# Patient Record
Sex: Male | Born: 1970 | Race: White | Hispanic: No | Marital: Single | Smoking: Never smoker
Health system: Southern US, Community
[De-identification: ages and names within clinical notes are randomized; demographics above are authoritative.]

---

## 2020-08-11 ENCOUNTER — Emergency Department
Admission: EM | Admit: 2020-08-11 | Discharge: 2020-08-11 | Disposition: A | Discharge status: Home / Self Care | Payer: Self-pay | Attending: Emergency Medicine | Admitting: Emergency Medicine

## 2020-08-11 ENCOUNTER — Emergency Department: Payer: Self-pay

## 2020-08-11 ENCOUNTER — Encounter: Payer: Self-pay | Admitting: Emergency Medicine

## 2020-08-11 ENCOUNTER — Other Ambulatory Visit: Payer: Self-pay

## 2020-08-11 DIAGNOSIS — M25512 Pain in left shoulder: Secondary | ICD-10-CM | POA: Insufficient documentation

## 2020-08-11 DIAGNOSIS — Z87891 Personal history of nicotine dependence: Secondary | ICD-10-CM | POA: Insufficient documentation

## 2020-08-11 DIAGNOSIS — R0789 Other chest pain: Secondary | ICD-10-CM | POA: Insufficient documentation

## 2020-08-11 DIAGNOSIS — R202 Paresthesia of skin: Secondary | ICD-10-CM | POA: Insufficient documentation

## 2020-08-11 LAB — CBC
HCT: 31.6 % — ABNORMAL LOW (ref 39.0–52.0)
Hemoglobin: 10.3 g/dL — ABNORMAL LOW (ref 13.0–17.0)
MCH: 26.3 pg (ref 26.0–34.0)
MCHC: 32.6 g/dL (ref 30.0–36.0)
MCV: 80.6 fL (ref 80.0–100.0)
Platelets: 580 10*3/uL — ABNORMAL HIGH (ref 150–400)
RBC: 3.92 MIL/uL — ABNORMAL LOW (ref 4.22–5.81)
RDW: 20.4 % — ABNORMAL HIGH (ref 11.5–15.5)
WBC: 10.7 10*3/uL — ABNORMAL HIGH (ref 4.0–10.5)
nRBC: 0 % (ref 0.0–0.2)

## 2020-08-11 LAB — BASIC METABOLIC PANEL
Anion gap: 11 (ref 5–15)
BUN: 17 mg/dL (ref 6–20)
CO2: 26 mmol/L (ref 22–32)
Calcium: 9.4 mg/dL (ref 8.9–10.3)
Chloride: 98 mmol/L (ref 98–111)
Creatinine, Ser: 0.56 mg/dL — ABNORMAL LOW (ref 0.61–1.24)
GFR calc Af Amer: >60 mL/min (ref >60)
GFR calc non Af Amer: >60 mL/min (ref >60)
Glucose, Bld: 100 mg/dL — ABNORMAL HIGH (ref 70–99)
Potassium: 4.2 mmol/L (ref 3.5–5.1)
Sodium: 135 mmol/L (ref 135–145)

## 2020-08-11 LAB — TROPONIN I (HIGH SENSITIVITY)
Troponin I (High Sensitivity): 3 ng/L (ref <18)
Troponin I (High Sensitivity): 5 ng/L (ref <18)

## 2020-08-11 NOTE — Discharge Instructions (Signed)
Please take Tylenol and ibuprofen/Advil for your pain.  It is safe to take them together, or to alternate them every few hours.  Take up to 1000mg  of Tylenol at a time, up to 4 times per day.  Do not take more than 4000 mg of Tylenol in 24 hours.  For ibuprofen, take 400-600 mg, 4-5 times per day.  Thankfully, there is no evidence of strain or damage to your heart.  You have not had a heart attack.  Good luck quitting smoking.  This is extremely difficult to do and I applaud you for taking the steps that you have already taken.  If you develop any worsening chest pains, especially in combination with fevers, passing out or throwing up, please return to the ED.

## 2020-08-11 NOTE — ED Provider Notes (Signed)
[facility] Emergency Department Provider Note ____________________________________________   First MD Initiated Contact with Patient 08/11/20 2109     (approximate)  I have reviewed the triage vital signs and the nursing notes.  HISTORY  Chief Complaint Chest Pain   HPI Todd Munoz is a 49 y.o. malewho presents to the ED for evaluation of chest pain.   Chart review indicates no relevant medical hx.   Patient reports stopping smoking cigarettes cold [place] about 1 week ago. Further reports his mother dying a few weeks ago and underwent a relative increase amount of stress.  Patient reports being in his typical state of health until developing rapid onset left shoulder/arm/chest pain with associated tingling to his left-sided fingertips this afternoon while in a seated position.  He reports it discomfort and denies any pain, so he does not quantify this pain.  He minimizes it severity indicates that it was not that bad, though did frighten him that he was having a heart attack.  Beyond the tingling to his left-sided fingertips, he denies any associated symptoms.  Denies nausea, vomiting, diaphoresis, syncope, shortness of breath, trauma.  He denies any chest pain now and reports feeling better after I tell him he has not had a heart attack.    No past medical history on file.  There are no problems to display for this patient.   History reviewed. No pertinent surgical history.  Prior to Admission medications   Not on File    Allergies Erythromycin, Penicillins, and Sulfa antibiotics  No family history on file.  Social History Social History   Tobacco Use  . Smoking status: Not on file  Substance Use Topics  . Alcohol use: Not on file  . Drug use: Not on file    Review of Systems  Constitutional: No fever/chills Eyes: No visual changes. ENT: No sore throat. Cardiovascular: Positive for chest pain Respiratory: Denies shortness of  breath. Gastrointestinal: No abdominal pain.  No nausea, no vomiting.  No diarrhea.  No constipation. Genitourinary: Negative for dysuria. Musculoskeletal: Negative for back pain. Skin: Negative for rash. Neurological: Negative for headaches, focal weakness or numbness.   ____________________________________________   PHYSICAL EXAM:  VITAL SIGNS: Vitals:   08/11/20 2001  BP: 116/76  Pulse: 77  Resp: 20  Temp: 98.4 F (36.9 C)  SpO2: 96%      Constitutional: Alert and oriented. Well appearing and in no acute distress. Eyes: Conjunctivae are normal. PERRL. EOMI. Head: Atraumatic. Nose: No congestion/rhinnorhea. Mouth/Throat: Mucous membranes are moist.  Oropharynx non-erythematous. Neck: No stridor. No cervical spine tenderness to palpation. Cardiovascular: Normal rate, regular rhythm. Grossly normal heart sounds.  Good peripheral circulation. Respiratory: Normal respiratory effort.  No retractions. Lungs CTAB. Gastrointestinal: Soft , nondistended, nontender to palpation. No abdominal bruits. No CVA tenderness. Musculoskeletal: No lower extremity tenderness nor edema.  No joint effusions. No signs of acute trauma. Neurologic:  Normal speech and language. No gross focal neurologic deficits are appreciated. No gait instability noted. Skin:  Skin is warm, dry and intact. No rash noted. Psychiatric: Mood and affect are normal. Speech and behavior are normal.  ____________________________________________   LABS (all labs ordered are listed, but only abnormal results are displayed)  Labs Reviewed  BASIC METABOLIC PANEL - Abnormal; Notable for the following components:      Result Value   Glucose, Bld 100 (*)    Creatinine, Ser 0.56 (*)    All other components within normal limits  CBC - Abnormal; Notable for  the following components:   WBC 10.7 (*)    RBC 3.92 (*)    Hemoglobin 10.3 (*)    HCT 31.6 (*)    RDW 20.4 (*)    Platelets 580 (*)    All other components  within normal limits  TROPONIN I (HIGH SENSITIVITY)  TROPONIN I (HIGH SENSITIVITY)   ____________________________________________  12 Lead EKG  Sinus rhythm, rate of 77 bpm.  Normal axis and intervals.  No evidence of acute ischemia. ____________________________________________  RADIOLOGY  ED MD interpretation: 2 view CXR reviewed without evidence of acute cardiopulmonary pathology.  Official radiology report(s): DG Chest 2 View  Result Date: 08/11/2020 CLINICAL DATA:  Chest pain EXAM: CHEST - 2 VIEW COMPARISON:  None. FINDINGS: The heart size and mediastinal contours are within normal limits. Both lungs are clear. The visualized skeletal structures are unremarkable. IMPRESSION: No active cardiopulmonary disease. Electronically Signed   By: Jonna Clark M.D.   On: 08/11/2020 20:40    ____________________________________________   PROCEDURES and INTERVENTIONS  Procedure(s) performed (including Critical Care):  Procedures  Medications - No data to display  ____________________________________________   MDM / ED COURSE  49 year old male presents with atypical chest pain without evidence of ACS and amenable to outpatient management.  Normal vital signs.  Normal exam.  Asymptomatic in ED.  Benign work-up.  Nonischemic EKG negative troponin.  We discussed outpatient management and return precautions for the ED.  Patient medically stable for discharge home.  Clinical Course as of Aug 12 2243  Fri Aug 11, 2020  2242 Educated patient on repeat troponin negative and reassuring.  He continues to be chest pain-free.  We discussed outpatient management and return precautions for the ED.   [DS]    Clinical Course User Index [DS] Delton Prairie, MD     ____________________________________________   FINAL CLINICAL IMPRESSION(S) / ED DIAGNOSES  Final diagnoses:  Other chest pain  Tingling of upper extremity     ED Discharge Orders    None       Youlanda Tomassetti   Note:   This document was prepared using Dragon voice recognition software and may include unintentional dictation errors.   Delton Prairie, MD 08/11/20 [phone]

## 2020-08-11 NOTE — ED Triage Notes (Signed)
Pt reports chest pain couple of hrs ago, reports was sitting no exertion. reports felt dizzy, reports at time of chest pain mild shortness of breath, tingling to left finger , lpt reports he took 2 baby aspirin and 20mg  of propranlol that he had.  Pt talks in complete sentences no respiratory distress noted.

## 2021-05-06 IMAGING — CR DG CHEST 2V
1 series · 2 of 2 positions shown · non-contrast
Comparison: None.

CLINICAL DATA: Chest pain

EXAM:
CHEST - 2 VIEW

[Series 1: dg chest 2 view · 0.14mm/px · 2 of 2 slices shown]
[im 1/2]
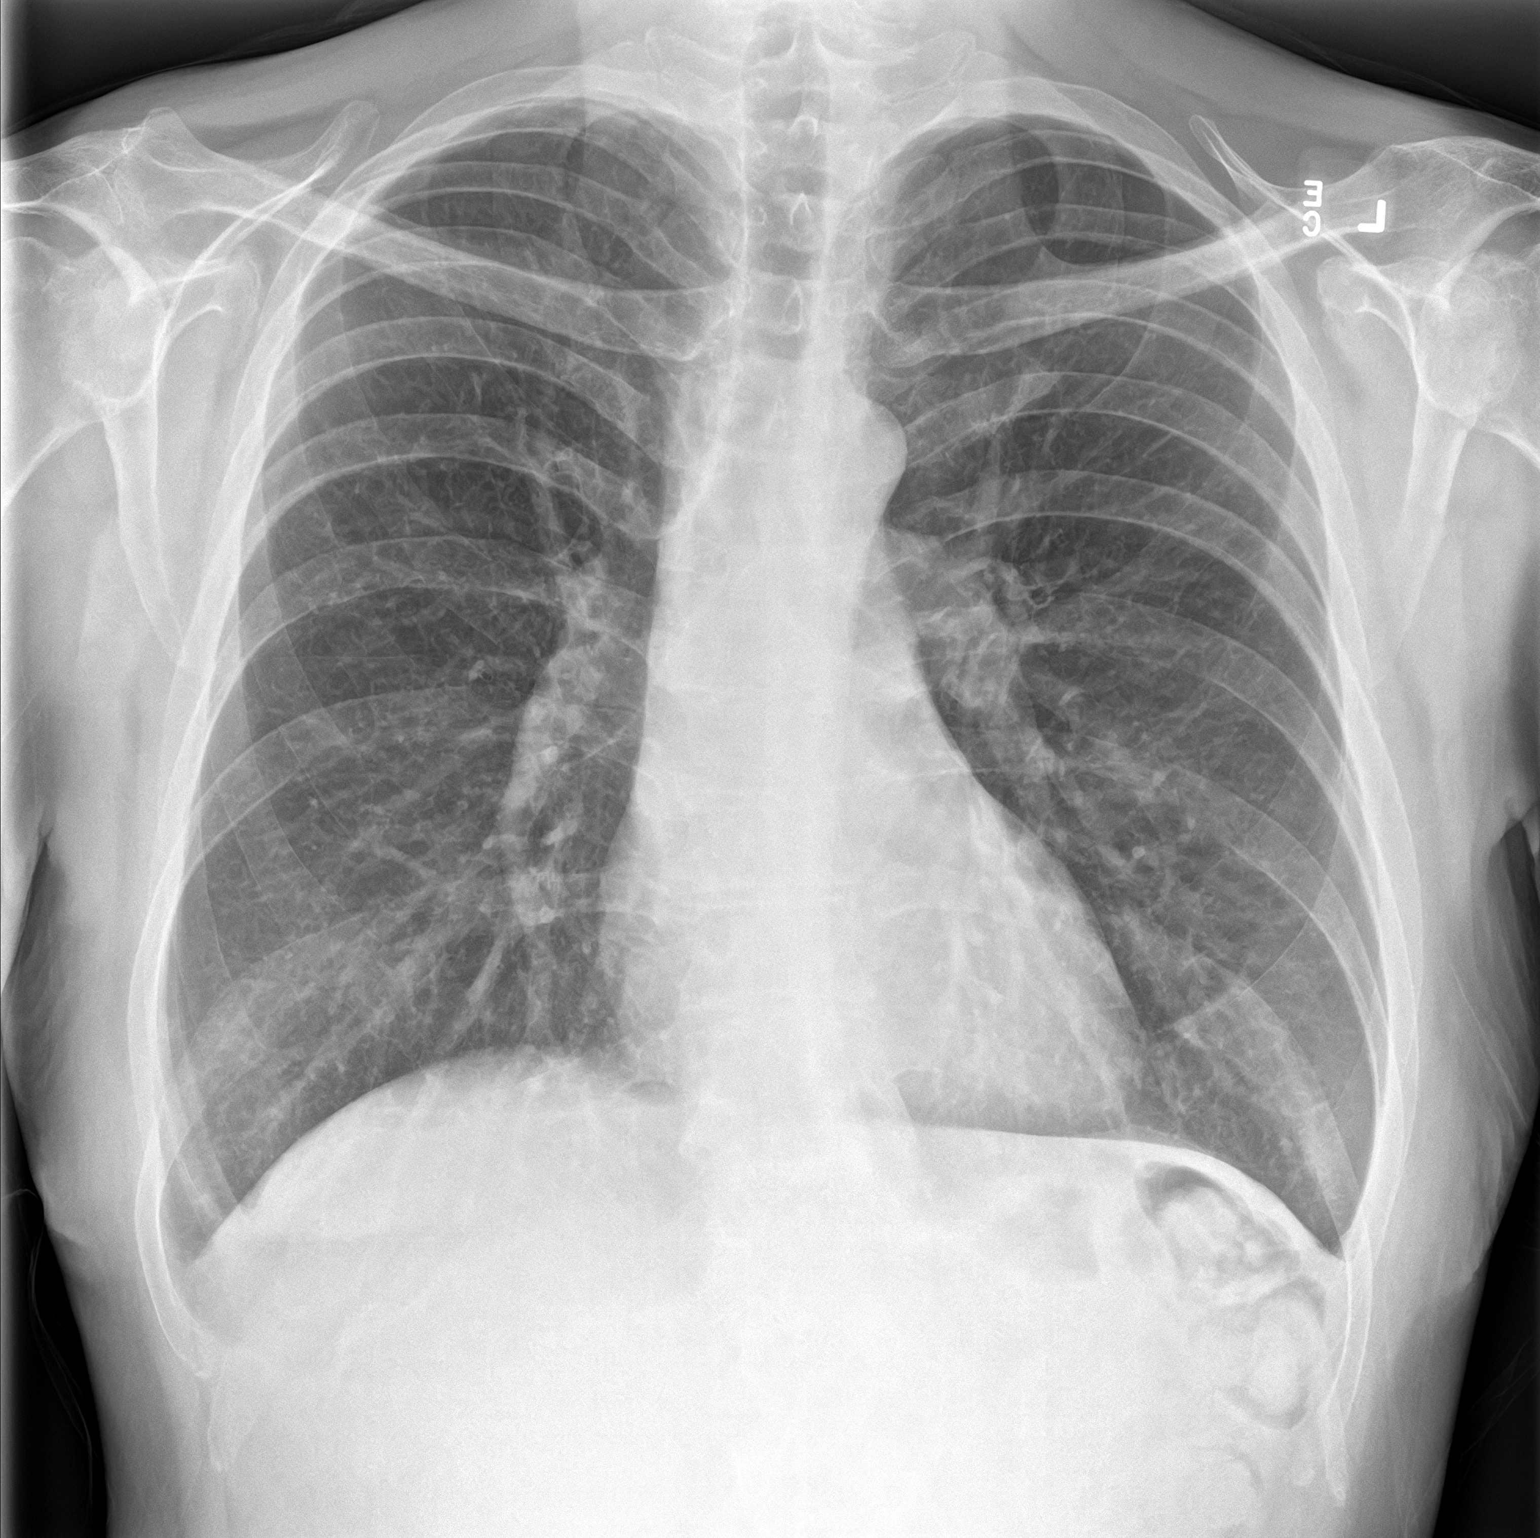
[im 2/2]
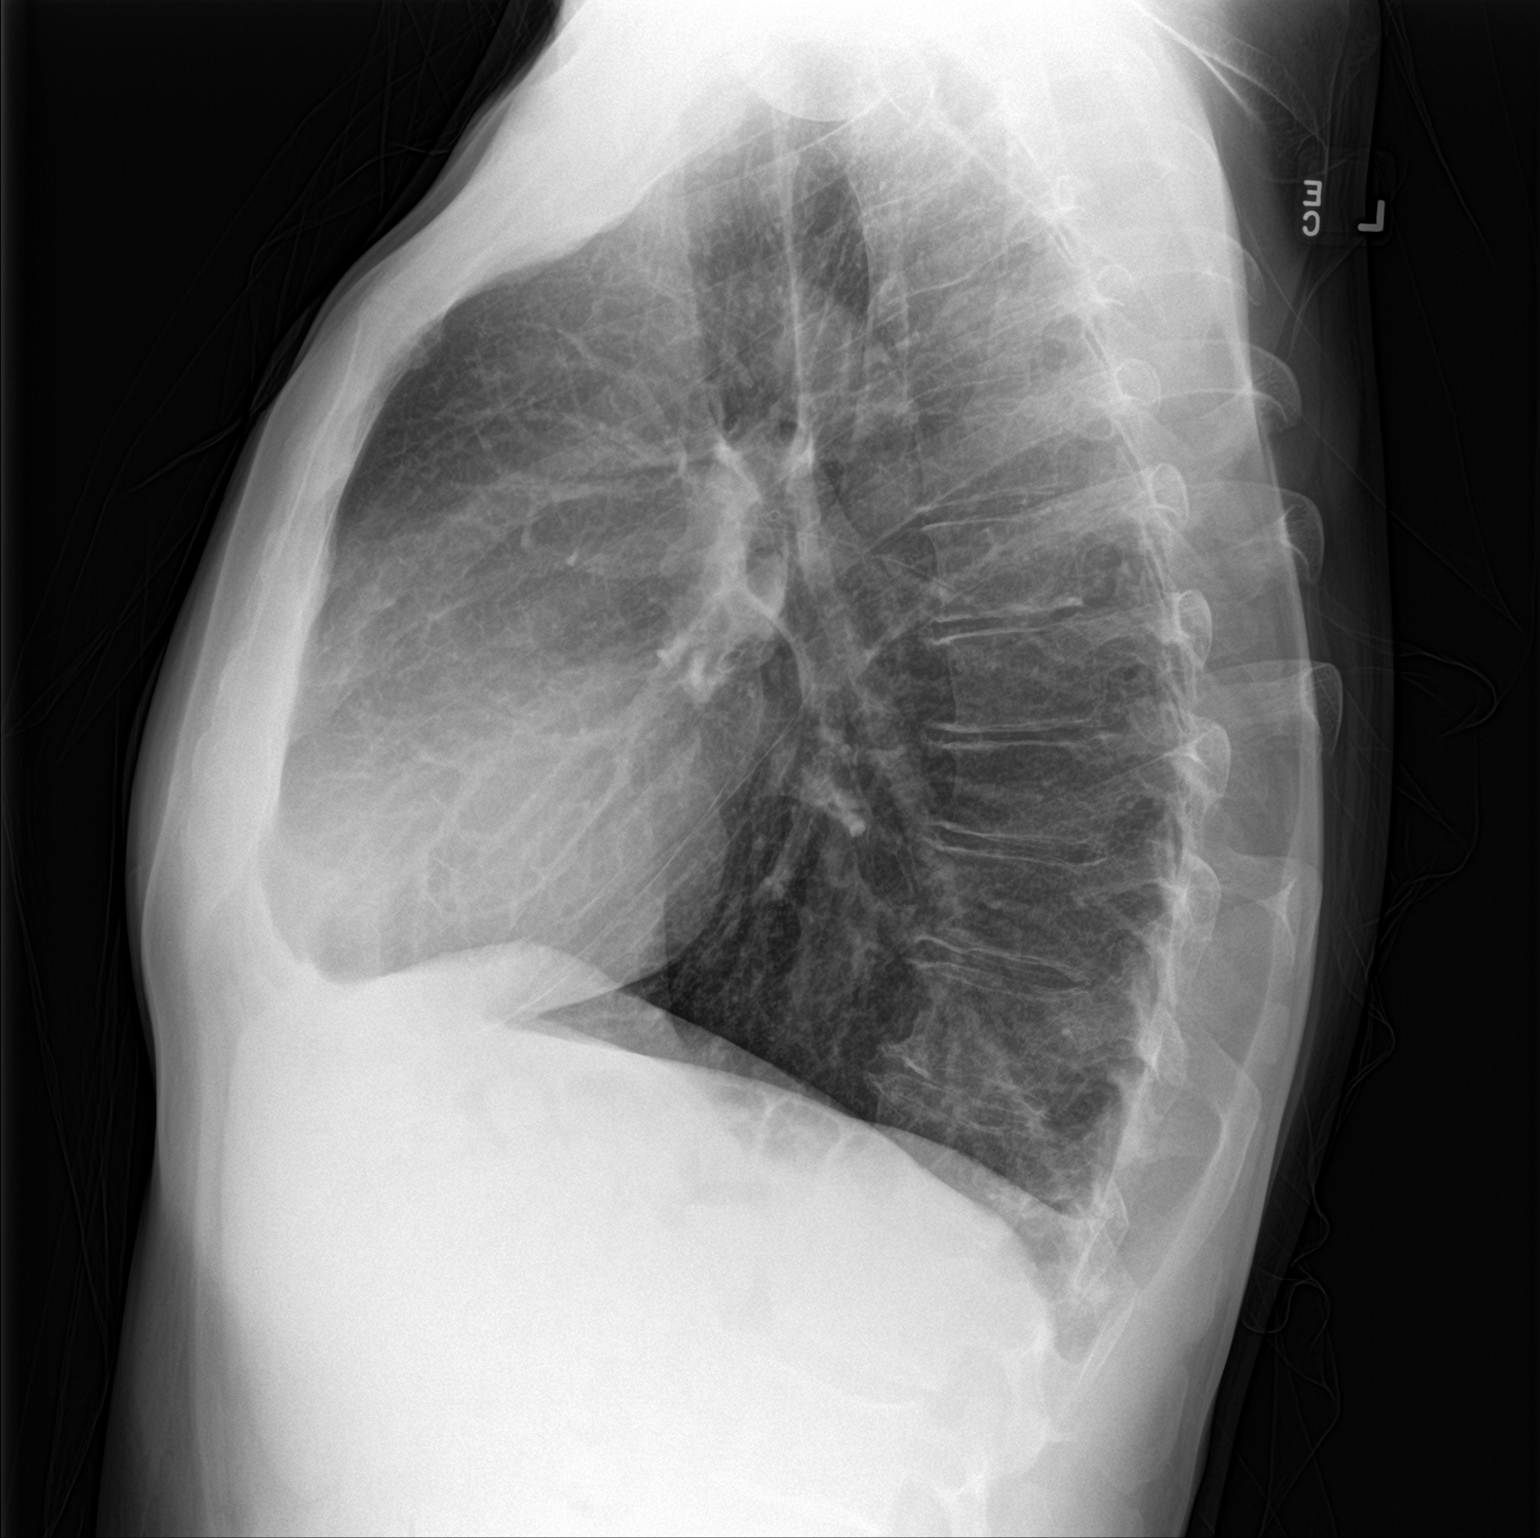

[2 of 2 positions shown; findings below may reference images not displayed]

FINDINGS: The heart size and mediastinal contours are within normal limits.
Both lungs are clear. The visualized skeletal structures are
unremarkable.
IMPRESSION: No active cardiopulmonary disease.

## 2022-10-20 ENCOUNTER — Emergency Department: Payer: 59

## 2022-10-20 ENCOUNTER — Other Ambulatory Visit: Payer: Self-pay

## 2022-10-20 ENCOUNTER — Emergency Department
Admission: EM | Admit: 2022-10-20 | Discharge: 2022-10-20 | Disposition: A | Discharge status: Home / Self Care | Payer: 59 | Attending: Emergency Medicine | Admitting: Emergency Medicine

## 2022-10-20 DIAGNOSIS — R109 Unspecified abdominal pain: Secondary | ICD-10-CM | POA: Diagnosis not present

## 2022-10-20 DIAGNOSIS — R1011 Right upper quadrant pain: Secondary | ICD-10-CM | POA: Diagnosis not present

## 2022-10-20 DIAGNOSIS — I700 Atherosclerosis of aorta: Secondary | ICD-10-CM | POA: Diagnosis not present

## 2022-10-20 DIAGNOSIS — N200 Calculus of kidney: Secondary | ICD-10-CM

## 2022-10-20 DIAGNOSIS — D72829 Elevated white blood cell count, unspecified: Secondary | ICD-10-CM | POA: Diagnosis not present

## 2022-10-20 DIAGNOSIS — N1330 Unspecified hydronephrosis: Secondary | ICD-10-CM | POA: Diagnosis not present

## 2022-10-20 LAB — CBC WITH DIFFERENTIAL/PLATELET
Abs Immature Granulocytes: 0.05 10*3/uL (ref 0.00–0.07)
Basophils Absolute: 0.1 10*3/uL (ref 0.0–0.1)
Basophils Relative: 1 %
Eosinophils Absolute: 0.1 10*3/uL (ref 0.0–0.5)
Eosinophils Relative: 0 %
HCT: 38.4 % — ABNORMAL LOW (ref 39.0–52.0)
Hemoglobin: 12.8 g/dL — ABNORMAL LOW (ref 13.0–17.0)
Immature Granulocytes: 0 %
Lymphocytes Relative: 9 %
Lymphs Abs: 1.4 10*3/uL (ref 0.7–4.0)
MCH: 30.3 pg (ref 26.0–34.0)
MCHC: 33.3 g/dL (ref 30.0–36.0)
MCV: 90.8 fL (ref 80.0–100.0)
Monocytes Absolute: 1 10*3/uL (ref 0.1–1.0)
Monocytes Relative: 6 %
Neutro Abs: 12.7 10*3/uL — ABNORMAL HIGH (ref 1.7–7.7)
Neutrophils Relative %: 84 %
Platelets: 423 10*3/uL — ABNORMAL HIGH (ref 150–400)
RBC: 4.23 MIL/uL (ref 4.22–5.81)
RDW: 14.6 % (ref 11.5–15.5)
WBC: 15.3 10*3/uL — ABNORMAL HIGH (ref 4.0–10.5)
nRBC: 0 % (ref 0.0–0.2)

## 2022-10-20 LAB — URINALYSIS, ROUTINE W REFLEX MICROSCOPIC
Bacteria, UA: NONE SEEN
Bilirubin Urine: NEGATIVE
Glucose, UA: NEGATIVE mg/dL
Ketones, ur: 80 mg/dL — AB
Leukocytes,Ua: NEGATIVE
Nitrite: NEGATIVE
Protein, ur: 30 mg/dL — AB
RBC / HPF: >50 RBC/hpf — ABNORMAL HIGH (ref 0–5)
Specific Gravity, Urine: 1.034 — ABNORMAL HIGH (ref 1.005–1.030)
pH: 8 (ref 5.0–8.0)

## 2022-10-20 LAB — COMPREHENSIVE METABOLIC PANEL
ALT: 14 U/L (ref 0–44)
AST: 28 U/L (ref 15–41)
Albumin: 4.2 g/dL (ref 3.5–5.0)
Alkaline Phosphatase: 68 U/L (ref 38–126)
Anion gap: 9 (ref 5–15)
BUN: 17 mg/dL (ref 6–20)
CO2: 24 mmol/L (ref 22–32)
Calcium: 9.7 mg/dL (ref 8.9–10.3)
Chloride: 105 mmol/L (ref 98–111)
Creatinine, Ser: 0.65 mg/dL (ref 0.61–1.24)
GFR, Estimated: >60 mL/min (ref >60)
Glucose, Bld: 148 mg/dL — ABNORMAL HIGH (ref 70–99)
Potassium: 3.7 mmol/L (ref 3.5–5.1)
Sodium: 138 mmol/L (ref 135–145)
Total Bilirubin: 1 mg/dL (ref 0.3–1.2)
Total Protein: 7.3 g/dL (ref 6.5–8.1)

## 2022-10-20 LAB — LIPASE, BLOOD: Lipase: 23 U/L (ref 11–51)

## 2022-10-20 MED ORDER — ONDANSETRON HCL 4 MG/2ML IJ SOLN
4.0000 mg | Freq: Once | INTRAMUSCULAR | Status: AC
  Administered 2022-10-20: 4 mg via INTRAVENOUS
  Filled 2022-10-20: qty 2

## 2022-10-20 MED ORDER — CEPHALEXIN 500 MG PO CAPS: 500.0000 mg | ORAL_CAPSULE | Freq: Three times a day (TID) | ORAL | 0 refills | Status: AC

## 2022-10-20 MED ORDER — KETOROLAC TROMETHAMINE 30 MG/ML IJ SOLN
30.0000 mg | Freq: Once | INTRAMUSCULAR | Status: AC
  Administered 2022-10-20: 30 mg via INTRAVENOUS
  Filled 2022-10-20: qty 1

## 2022-10-20 MED ORDER — SODIUM CHLORIDE 0.9 % IV BOLUS
500.0000 mL | Freq: Once | INTRAVENOUS | Status: AC
  Administered 2022-10-20: 500 mL via INTRAVENOUS

## 2022-10-20 MED ORDER — IOHEXOL 350 MG/ML SOLN
100.0000 mL | Freq: Once | INTRAVENOUS | Status: AC | PRN
  Administered 2022-10-20: 100 mL via INTRAVENOUS

## 2022-10-20 MED ORDER — MORPHINE SULFATE (PF) 4 MG/ML IV SOLN
4.0000 mg | Freq: Once | INTRAVENOUS | Status: AC
  Administered 2022-10-20: 4 mg via INTRAVENOUS
  Filled 2022-10-20: qty 1

## 2022-10-20 MED ORDER — SODIUM CHLORIDE 0.9 % IV BOLUS (SEPSIS)
1000.0000 mL | Freq: Once | INTRAVENOUS | Status: AC
  Administered 2022-10-20: 1000 mL via INTRAVENOUS

## 2022-10-20 MED ORDER — CEPHALEXIN 500 MG PO CAPS
500.0000 mg | ORAL_CAPSULE | Freq: Once | ORAL | Status: AC
  Administered 2022-10-20: 500 mg via ORAL
  Filled 2022-10-20: qty 1

## 2022-10-20 MED ORDER — OXYCODONE-ACETAMINOPHEN 5-325 MG PO TABS: 1.0000 | ORAL_TABLET | ORAL | 0 refills | Status: AC | PRN

## 2022-10-20 MED ORDER — ONDANSETRON 4 MG PO TBDP: 4.0000 mg | ORAL_TABLET | Freq: Three times a day (TID) | ORAL | 0 refills | Status: AC | PRN

## 2022-10-20 MED ORDER — HYDROMORPHONE HCL 1 MG/ML IJ SOLN
1.0000 mg | Freq: Once | INTRAMUSCULAR | Status: AC
  Administered 2022-10-20: 1 mg via INTRAVENOUS
  Filled 2022-10-20: qty 1

## 2022-10-20 NOTE — ED Notes (Signed)
Patient transported back from CT 

## 2022-10-20 NOTE — ED Triage Notes (Signed)
Pt reports right flank pain beginning last night before going to sleep, pt thought he was going to have diarrhea. Pt has had constant cramping in same spot since. Pt denies N/V/fever.

## 2022-10-20 NOTE — ED Provider Notes (Addendum)
[facility] Provider Note    Event Date/Time   First MD Initiated Contact with Patient 10/20/22 [phone]     (approximate)   History   Flank Pain   HPI  Todd Munoz is a 51 y.o. 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  Has had nausea but no vomiting or diarrhea.  No fevers.  No dysuria or hematuria.  Never had similar symptoms.  No other prior abdominal surgeries.  No history of kidney stones, gallstones.   History provided by patient.    History reviewed. No pertinent past medical history.  History reviewed. No pertinent surgical history.  MEDICATIONS:  Prior to Admission medications   Not on File    Physical Exam   Triage Vital Signs: ED Triage Vitals [10/20/22 0522]  Enc Vitals Group     BP (!) 152/87     Pulse Rate 68     Resp 17     Temp 98.2 F (36.8 C)     Temp Source Oral     SpO2 100 %     Weight 220 lb (99.8 kg)     Height 6\' 4"  (1.93 m)     Head Circumference      Peak Flow      Pain Score 8     Pain Loc      Pain Edu?      Excl. in GC?     Most recent vital signs: Vitals:   10/20/22 0545 10/20/22 0630  BP: (!) 148/87 (!) 147/90  Pulse: 70 64  Resp: 18 20  Temp:    SpO2: 100% 100%    CONSTITUTIONAL: Alert and oriented and responds appropriately to questions.  Appears uncomfortable HEAD: Normocephalic, atraumatic EYES: Conjunctivae clear, pupils appear equal, sclera nonicteric ENT: normal nose; moist mucous membranes NECK: Supple, normal ROM CARD: RRR; S1 and S2 appreciated; no murmurs, no clicks, no rubs, no gallops RESP: Normal chest excursion without splinting or tachypnea; breath sounds clear and equal bilaterally; no wheezes, no rhonchi, no rales, no  hypoxia or respiratory distress, speaking full sentences ABD/GI: Normal bowel sounds; non-distended; soft, tender in the right upper quadrant with voluntary guarding, also tender over the right flank BACK: The back appears normal EXT: Normal ROM in all joints; no deformity noted, no edema; no cyanosis SKIN: Normal color for age and race; warm; no rash on exposed skin NEURO: Moves all extremities equally, normal speech PSYCH: The patient's mood and manner are appropriate.   ED Results / Procedures / Treatments   LABS: (all labs ordered are listed, but only abnormal results are displayed) Labs Reviewed  CBC WITH DIFFERENTIAL/PLATELET - Abnormal; Notable for the following components:      Result Value   WBC 15.3 (*)    Hemoglobin 12.8 (*)    HCT 38.4 (*)    Platelets 423 (*)    Neutro Abs 12.7 (*)    All other components within normal limits  COMPREHENSIVE METABOLIC PANEL - Abnormal; Notable for the following components:   Glucose, Bld 148 (*)    All other components within normal limits  LIPASE, BLOOD  URINALYSIS, ROUTINE W REFLEX MICROSCOPIC     EKG:  RADIOLOGY: My personal review and interpretation of imaging:  10/22/22 unremarkable.  I have personally reviewed all radiology reports.   No results found.   PROCEDURES:  Critical Care performed: No     Procedures    IMPRESSION / MDM / ASSESSMENT AND PLAN / ED COURSE  I reviewed the triage vital signs and the nursing notes.    Patient here with right flank and right upper quadrant abdominal pain.     DIFFERENTIAL DIAGNOSIS (includes but not limited to):   Cholelithiasis, cholecystitis, cholangitis, choledocholithiasis, pancreatitis, pyelonephritis, kidney stone, less likely appendicitis   Patient's presentation is most consistent with acute presentation with potential threat to life or bodily function.   PLAN: We will obtain CBC, CMP, lipase, urinalysis, right upper quadrant ultrasound.  Will give IV fluids,  pain and nausea medicine.  States his aunt drove him here to the emergency department.   MEDICATIONS GIVEN IN ED: Medications  morphine (PF) 4 MG/ML injection 4 mg (4 mg Intravenous Given 10/20/22 0546)  ondansetron (ZOFRAN) injection 4 mg (4 mg Intravenous Given 10/20/22 0546)  sodium chloride 0.9 % bolus 1,000 mL (0 mLs Intravenous Stopped 10/20/22 0640)  HYDROmorphone (DILAUDID) injection 1 mg (1 mg Intravenous Given 10/20/22 0640)  ketorolac (TORADOL) 30 MG/ML injection 30 mg (30 mg Intravenous Given 10/20/22 0640)  iohexol (OMNIPAQUE) 350 MG/ML injection 100 mL (100 mLs Intravenous Contrast Given 10/20/22 0644)     ED COURSE: Labs show leukocytosis of 15,000 with left shift.  Normal electrolytes, renal function, LFTs and lipase.  Right upper quadrant ultrasound reviewed and interpreted by myself and does not show acute abnormality.  Patient continues to have significant pain.  Will give Toradol, morphine and obtain CT of the abdomen pelvis.  7:10 AM  CT scan, urinalysis pending.  Signed out to oncoming ED physician.  Patient seems more comfortable after redosing pain medication.`   CONSULTS:  Dispo pending further workup.   OUTSIDE RECORDS REVIEWED:  none       FINAL CLINICAL IMPRESSION(S) / ED DIAGNOSES   Final diagnoses:  RUQ abdominal pain  Right flank pain     Rx / DC Orders   ED Discharge Orders     None        Note:  This document was prepared using Dragon voice recognition software and may include unintentional dictation errors.   Mekhi Lascola, Layla Maw, DO 10/20/22 0710    Jendaya Gossett, Layla Maw, DO 10/20/22 [phone]

## 2022-10-20 NOTE — ED Provider Notes (Addendum)
Patient received in signout from Dr. Elesa Massed pending follow-up imaging as well as urinalysis.  Patient has evidence of ureteral stone with hydro and possible forniceal rupture.  No urinoma.  Urine is without signs of infection.  Will place on empiric Keflex (has tolerated in the past) given possible forniceal rupture he is currently pain-free.  His well and appropriate for outpatient follow-up.  Discussed case in consultation with urology who will help arrange close outpatient follow-up.  I discussed strict return precautions.   Willy Eddy, MD 10/20/22 [id number]    Willy Eddy, MD 10/20/22 [phone]

## 2022-10-20 NOTE — ED Notes (Signed)
Patient transported to CT 

## 2022-10-20 NOTE — ED Notes (Signed)
Pt in bed, pt reports a decrease in pain, pt states that he is ready to go home, pt from dpt with sig other.

## 2022-10-20 NOTE — ED Notes (Signed)
US at bedside

## 2022-10-21 ENCOUNTER — Telehealth: Payer: Self-pay | Admitting: *Deleted

## 2022-10-21 LAB — URINE CULTURE: Culture: NO GROWTH

## 2022-10-21 NOTE — Telephone Encounter (Signed)
Pt scheduled for [facility] tomorrow

## 2022-10-21 NOTE — Telephone Encounter (Signed)
Tried calling home number for pt and cell phone for emergency contact, left message for Aunt, will try again later

## 2022-10-21 NOTE — Telephone Encounter (Signed)
-----   Message from Vanna Scotland, MD sent at 10/20/2022  9:04 AM EST ----- Regarding: Stone follow-up This guy was in the emergency room on Sunday with an acute stone event.  He also needs follow-up early this week.  Vanna Scotland

## 2022-10-22 ENCOUNTER — Ambulatory Visit
Admission: RE | Admit: 2022-10-22 | Discharge: 2022-10-22 | Disposition: A | Discharge status: Home / Self Care | Payer: 59 | Source: Ambulatory Visit | Attending: Urology | Admitting: Urology

## 2022-10-22 ENCOUNTER — Ambulatory Visit
Admission: RE | Admit: 2022-10-22 | Discharge: 2022-10-22 | Disposition: A | Discharge status: Home / Self Care | Payer: 59 | Attending: Urology | Admitting: Urology

## 2022-10-22 ENCOUNTER — Ambulatory Visit: Payer: 59 | Admitting: Urology

## 2022-10-22 ENCOUNTER — Encounter: Payer: Self-pay | Admitting: Urology

## 2022-10-22 ENCOUNTER — Other Ambulatory Visit: Payer: Self-pay

## 2022-10-22 VITALS — BP 112/70 | HR 76 | Ht 76.0 in | Wt 220.0 lb

## 2022-10-22 DIAGNOSIS — N200 Calculus of kidney: Secondary | ICD-10-CM | POA: Diagnosis not present

## 2022-10-22 DIAGNOSIS — Z87442 Personal history of urinary calculi: Secondary | ICD-10-CM | POA: Diagnosis not present

## 2022-10-22 DIAGNOSIS — N201 Calculus of ureter: Secondary | ICD-10-CM | POA: Diagnosis not present

## 2022-10-22 DIAGNOSIS — M439 Deforming dorsopathy, unspecified: Secondary | ICD-10-CM | POA: Diagnosis not present

## 2022-10-22 MED ORDER — KETOROLAC TROMETHAMINE 10 MG PO TABS: 10.0000 mg | ORAL_TABLET | Freq: Four times a day (QID) | ORAL | 0 refills | Status: DC | PRN

## 2022-10-22 MED ORDER — KETOROLAC TROMETHAMINE 10 MG PO TABS: 10.0000 mg | ORAL_TABLET | Freq: Four times a day (QID) | ORAL | 0 refills | Status: AC | PRN

## 2022-10-22 NOTE — Progress Notes (Signed)
   10/22/22 3:14 PM   Todd Munoz 1971/10/22 [id number]  CC: Nephrolithiasis  HPI: 51 year old male with history of rheumatoid arthritis who was seen in the ER on 10/20/22 for right-sided flank pain.  CT showed a 4 mm right proximal ureteral stone with upstream hydronephrosis, but no other kidney stones.  He has felt much better since that ER visit and denies any pain over the last 24 hours.   No prior history of kidney stones.  He denies any dysuria or gross hematuria.  Family History: History reviewed. No pertinent family history.  Social History:  reports that he has never smoked. He has never used smokeless tobacco. He reports that he does not drink alcohol and does not use drugs.  Physical Exam: BP 112/70 (BP Location: Left Arm, Patient Position: Sitting, Cuff Size: Large)   Pulse 76   Ht 6\' 4"  (1.93 m)   Wt 220 lb (99.8 kg)   BMI 26.78 kg/m    Constitutional:  Alert and oriented, No acute distress. Cardiovascular: No clubbing, cyanosis, or edema. Respiratory: Normal respiratory effort, no increased work of breathing. GI: Abdomen is soft, nontender, nondistended, no abdominal masses  Laboratory Data: Reviewed, urine culture negative  Pertinent Imaging: I have personally viewed and interpreted the CT abdomen and pelvis dated 10/20/2022 showing a 4 mm right proximal ureteral stone, HU 300, no other renal stones.  No definite stones on KUB today, but would not be visible if uric acid  Assessment & Plan:   51 year old male who presented with a 4 mm right proximal ureteral stone and right-sided flank pain that has since resolved.   We discussed various treatment options for urolithiasis including observation with or without medical expulsive therapy, shockwave lithotripsy (SWL), ureteroscopy and laser lithotripsy with stent placement, and percutaneous nephrolithotomy.  We discussed that management is based on stone size, location, density, patient co-morbidities, and  patient preference.   Stones <37mm in size have a >80% spontaneous passage rate. Data surrounding the use of tamsulosin for medical expulsive therapy is controversial, but meta analyses suggests it is most efficacious for distal stones between 5-68mm in size. Possible side effects include dizziness/lightheadedness, and retrograde ejaculation.  SWL has a lower stone free rate in a single procedure, but also a lower complication rate compared to ureteroscopy and avoids a stent and associated stent related symptoms. Possible complications include renal hematoma, steinstrasse, and need for additional treatment.  Ureteroscopy with laser lithotripsy and stent placement has a higher stone free rate than SWL in a single procedure, however increased complication rate including possible infection, ureteral injury, bleeding, and stent related morbidity. Common stent related symptoms include dysuria, urgency/frequency, and flank pain.  We discussed options including repeat CT to confirm passage or observation.  He would like to continue with observation, and oral Toradol was sent to pharmacy in case he has a recurrent bout of renal colic.  If he does have recurrent pain, okay to schedule right-sided ureteroscopy/laser/stent  9m, MD 10/22/2022  [facility] [address] [place], [place] [phone]

## 2022-10-22 NOTE — Patient Instructions (Signed)
Kidney Stones  Kidney stones are solid, rock-like deposits that form inside of the kidneys. The kidneys are a pair of organs that make urine. A kidney stone may form in a kidney and move into other parts of the urinary tract, including the tubes that connect the kidneys to the bladder (ureters), the bladder, and the tube that carries urine out of the body (urethra). As the stone moves through these areas, it can cause intense pain and block the flow of urine. Kidney stones are created when high levels of certain minerals are found in the urine. The stones are usually passed out of the body through urination, but in some cases, medical treatment may be needed to remove them. What are the causes? Kidney stones may be caused by: A condition in which certain glands produce too much parathyroid hormone (primary hyperparathyroidism), which causes too much calcium buildup in the blood. A buildup of uric acid crystals in the bladder (hyperuricosuria). Uric acid is a chemical that the body produces when you eat certain foods. It usually leaves the body in the urine. Narrowing (stricture) of one or both of the ureters. A kidney blockage that is present at birth (congenital obstruction). Past surgery on the kidney or the ureters. What increases the risk? The following factors may make you more likely to develop this condition: Having had a kidney stone in the past. Having a family history of kidney stones. Not drinking enough water. Eating a diet that is high in protein, salt (sodium), or sugar. Being overweight or obese. What are the signs or symptoms? Symptoms of a kidney stone may include: Pain in the side of the abdomen, right below the ribs (flank pain). Pain usually spreads (radiates) to the groin. Needing to urinate often or urgently. Painful urination. Blood in the urine (hematuria). Nausea. Vomiting. Fever and chills. How is this diagnosed? This condition may be diagnosed based on: Your  symptoms and medical history. A physical exam. Blood tests. Urine tests. These may be done before and after the stone passes out of your body through urination. Imaging tests, such as a CT scan, abdominal X-ray, or ultrasound. A procedure to examine the inside of the bladder (cystoscopy). How is this treated? Treatment for kidney stones depends on the size, location, and makeup of the stones. Kidney stones will often pass out of the body through urination. You may need to: Increase your fluid intake to help pass the stone. In some cases, you may be given fluids through an IV and may need to be monitored in the hospital. Take medicine for pain. Make changes in your diet to help prevent kidney stones from coming back. Sometimes, procedures are needed to remove a kidney stone. This may involve: A procedure to break up kidney stones using: A focused beam of light (laser therapy). Shock waves (extracorporeal shock wave lithotripsy). Surgery to remove kidney stones. This may be needed if you have severe pain or have stones that block your urinary tract. Follow these instructions at home: Medicines Take over-the-counter and prescription medicines only as told by your health care provider. Ask your health care provider if the medicine prescribed to you requires you to avoid driving or using heavy machinery. Eating and drinking Drink enough fluid to keep your urine pale yellow. You may be instructed to drink at least 8-10 glasses of water each day. This will help you pass the kidney stone. If directed, change your diet. This may include: Limiting how much sodium you eat. Eating more fruits   and vegetables. Limiting how much animal protein you eat. Animal proteins include red meat, poultry, fish, and eggs. Eating a normal amount of calcium (1,000-1,300 mg per day). Follow instructions from your health care provider about eating or drinking restrictions. General instructions Collect urine samples as  told by your health care provider. You may need to collect a urine sample: 24 hours after you pass the stone. 8-12 weeks after you pass the kidney stone, and every 6-12 months after that. Strain your urine every time you urinate, for as long as directed. Use the strainer that your health care provider recommends. Do not throw out the kidney stone after passing it. Keep the stone so it can be tested by your health care provider. Testing the makeup of your kidney stone may help prevent you from getting kidney stones in the future. Keep all follow-up visits. You may need follow-up X-rays or ultrasounds to make sure that your stone has passed. How is this prevented? To prevent another kidney stone: Drink enough fluid to keep your urine pale yellow. This is the best way to prevent kidney stones. Eat a healthy diet. Follow recommendations from your health care provider about foods to avoid. Recommendations vary depending on the type of kidney stone that you have. You may be instructed to eat a low-protein diet. Maintain a healthy weight. Where to find more information National Kid[employer] Foundation (NKF): www.kidney.org [facility] ([facility]): www.urologyhealth.org Contact a health care provider if: You have pain that gets worse or does not get better with medicine. Get help right away if: You have a fever or chills. You develop severe pain. You develop new abdominal pain. You faint. You are unable to urinate. Summary Kidney stones are solid, rock-like deposits that form inside of the kidneys. Kidney stones can cause nausea, vomiting, blood in the urine, abdominal pain, and the urge to urinate often. Treatment for kidney stones depends on the size, location, and makeup of the stones. Kidney stones will often pass out of the body through urination. Kidney stones can be prevented by drinking enough fluids, eating a healthy diet, and maintaining a healthy weight. This information is not intended  to replace advice given to you by your health care provider. Make sure you discuss any questions you have with your health care provider. Document Revised: 02/20/2022 Document Reviewed: 02/20/2022 Else[employer] Patient Education  2023 [employer]  Dietary Guidelines to Help Prevent Kidney Stones Kidney stones are deposits of minerals and salts that form inside your kidneys. Your risk of developing kidney stones may be greater depending on your diet, your lifestyle, the medicines you take, and whether you have certain medical conditions. Most people can lower their risks of developing kidney stones by following these dietary guidelines. Your dietitian may give you more specific instructions depending on your overall health and the type of kidney stones you tend to develop. What are tips for following this plan? Reading food labels  Choose foods with "no salt added" or "low-salt" labels. Limit your salt (sodium) intake to less than 1,500 mg a day. Choose foods with calcium for each meal and snack. Try to eat about 300 mg of calcium at each meal. Foods that contain 200-500 mg of calcium a serving include: 8 oz (237 mL) of milk, calcium-fortifiednon-dairy milk, and calcium-fortifiedfruit juice. Calcium-fortified means that calcium has been added to these drinks. 8 oz (237 mL) of kefir, yogurt, and soy yogurt. 4 oz (114 g) of tofu. 1 oz (28 g) of cheese. 1 cup (  150 g) of dried figs. 1 cup (91 g) of cooked broccoli. One 3 oz (85 g) can of sardines or mackerel. Most people need 1,000-1,500 mg of calcium a day. Talk to your dietitian about how much calcium is recommended for you. Shopping Buy plenty of fresh fruits and vegetables. Most people do not need to avoid fruits and vegetables, even if these foods contain nutrients that may contribute to kidney stones. When shopping for convenience foods, choose: Whole pieces of fruit. Pre-made salads with dressing on the side. Low-fat fruit and yogurt  smoothies. Avoid buying frozen meals or prepared deli foods. These can be high in sodium. Look for foods with live cultures, such as yogurt and kefir. Choose high-fiber grains, such as whole-wheat breads, oat bran, and wheat cereals. Cooking Do not add salt to food when cooking. Place a salt shaker on the table and allow each person to add their own salt to taste. Use vegetable protein, such as beans, textured vegetable protein (TVP), or tofu, instead of meat in pasta, casseroles, and soups. Meal planning Eat less salt, if told by your dietitian. To do this: Avoid eating processed or pre-made food. Avoid eating fast food. Eat less animal protein, including cheese, meat, poultry, or fish, if told by your dietitian. To do this: Limit the number of times you have meat, poultry, fish, or cheese each week. Eat a diet free of meat at least 2 days a week. Eat only one serving each day of meat, poultry, fish, or seafood. When you prepare animal proteins, cut pieces into small portion sizes. For most meat and fish, one serving is about the size of the palm of your hand. Eat at least five servings of fresh fruits and vegetables each day. To do this: Keep fruits and vegetables on hand for snacks. Eat one piece of fruit or a handful of berries with breakfast. Have a salad and fruit at lunch. Have two kinds of vegetables at dinner. You may be told to limit foods that are high in a substance called oxalate. These include: Spinach (cooked), rhubarb, beets, sweet potatoes, and Swiss chard. Peanuts. Potato chips, french fries, and baked potatoes with skin on. Nuts and nut products. Chocolate. If you regularly take a diuretic medicine, make sure to eat at least 1 or 2 servings of fruits or vegetables that are high in potassium each day. These include: Avocado. Banana. Orange, prune, carrot, or tomato juice. Baked potato. Cabbage. Beans and split peas. Lifestyle  Drink enough fluid to keep your urine  pale yellow. This is the most important thing you can do. Spread your fluid intake throughout the day. If you drink alcohol: Limit how much you have to: 0-1 drink a day for women who are not pregnant. 0-2 drinks a day for men. Know how much alcohol is in your drink. In the [place] one drink equals one 12 oz bottle of beer (355 mL), one 5 oz glass of wine (148 mL), or one 1 oz glass of hard liquor (44 mL). Lose weight if told by your health care provider. Work with your dietitian to find an eating plan and weight loss strategies that work best for you. General information Talk to your health care provider and dietitian about taking daily supplements. Depending on your health and the cause of your kidney stones, you may be told: Do not take high-dose supplements of vitamin C (1,000 mg a day or more). To take a calcium supplement. To take a daily probiotic supplement. To take   other supplements such as magnesium, fish oil, or vitamin B6. Take over-the-counter and prescription medicines only as told by your health care provider. These include supplements. What foods should I limit? Limit your intake of the following foods, or eat them as told by your dietitian. Vegetables Spinach. Rhubarb. Beets. Canned vegetables. Pickles. Olives. Baked potatoes with skin. Grains Wheat bran. Baked goods. Salted crackers. Cereals high in sugar. Meats and other proteins Nuts. Nut butters. Large portions of meat, poultry, or fish. Salted, precooked, or cured meats, such as sausages, meat loaves, and hot dogs. Dairy Cheeses. Beverages Regular soft drinks. Regular vegetable juice. Seasonings and condiments Seasoning blends with salt. Salad dressings. Soy sauce. Ketchup. Barbecue sauce. Other foods Canned soups. Canned pasta sauce. Casseroles. Pizza. Lasagna. Frozen meals. Potato chips. French fries. The items listed above may not be a complete list of foods and beverages you should limit. Contact a dietitian for  more information. What foods should I avoid? Talk to your dietitian about specific foods you should avoid based on the type of kidney stones you have and your overall health. Fruits Grapefruit. The item listed above may not be a complete list of foods and beverages you should avoid. Contact a dietitian for more information. Summary Kidney stones are deposits of minerals and salts that form inside your kidneys. You can lower your risk of kidney stones by making changes to your diet. The most important thing you can do is drink enough fluid. Drink enough fluid to keep your urine pale yellow. Talk to your dietitian about how much calcium you should have each day, and eat less salt and animal protein as told by your dietitian. This information is not intended to replace advice given to you by your health care provider. Make sure you discuss any questions you have with your health care provider. Document Revised: 02/21/2022 Document Reviewed: 02/21/2022 [employer] Patient Education  2023 [employer]  

## 2022-10-28 ENCOUNTER — Other Ambulatory Visit: Payer: Self-pay

## 2022-10-28 DIAGNOSIS — N200 Calculus of kidney: Secondary | ICD-10-CM

## 2022-11-07 ENCOUNTER — Other Ambulatory Visit
Admission: RE | Admit: 2022-11-07 | Discharge: 2022-11-07 | Disposition: A | Discharge status: Home / Self Care | Payer: 59 | Attending: Urology | Admitting: Urology

## 2022-11-07 DIAGNOSIS — N200 Calculus of kidney: Secondary | ICD-10-CM | POA: Insufficient documentation

## 2022-11-13 LAB — CALCULI, WITH PHOTOGRAPH (CLINICAL LAB)
Calcium Oxalate Monohydrate: 100 %
Weight Calculi: 13 mg
